# Patient Record
Sex: Male | Born: 1980 | Race: Black or African American | Hispanic: No | Marital: Single | Smoking: Current every day smoker
Health system: Southern US, Community
[De-identification: ages and names within clinical notes are randomized; demographics above are authoritative.]

## PROBLEM LIST (undated history)

## (undated) HISTORY — PX: FOOT SURGERY: SHX648

## (undated) HISTORY — PX: FINGER SURGERY: SHX640

---

## 2009-05-22 ENCOUNTER — Emergency Department (HOSPITAL_COMMUNITY): Admission: EM | Admit: 2009-05-22 | Discharge: 2009-05-22 | Payer: Self-pay | Admitting: Emergency Medicine

## 2009-05-22 ENCOUNTER — Encounter: Payer: Self-pay | Admitting: Orthopedic Surgery

## 2009-05-24 ENCOUNTER — Ambulatory Visit: Payer: Self-pay | Admitting: Orthopedic Surgery

## 2009-05-24 DIAGNOSIS — S62609B Fracture of unspecified phalanx of unspecified finger, initial encounter for open fracture: Secondary | ICD-10-CM | POA: Insufficient documentation

## 2009-05-29 ENCOUNTER — Ambulatory Visit: Payer: Self-pay | Admitting: Orthopedic Surgery

## 2009-05-30 ENCOUNTER — Ambulatory Visit: Payer: Self-pay | Admitting: Orthopedic Surgery

## 2009-05-30 ENCOUNTER — Ambulatory Visit (HOSPITAL_COMMUNITY): Admission: RE | Admit: 2009-05-30 | Discharge: 2009-05-30 | Payer: Self-pay | Admitting: Orthopedic Surgery

## 2009-06-05 ENCOUNTER — Ambulatory Visit: Payer: Self-pay | Admitting: Orthopedic Surgery

## 2009-06-12 ENCOUNTER — Ambulatory Visit: Payer: Self-pay | Admitting: Orthopedic Surgery

## 2009-06-27 ENCOUNTER — Ambulatory Visit: Payer: Self-pay | Admitting: Orthopedic Surgery

## 2009-08-28 ENCOUNTER — Encounter: Payer: Self-pay | Admitting: Orthopedic Surgery

## 2010-11-12 NOTE — Letter (Signed)
Summary: Bradley Martinez discount effec 06/26/09-12/24/09  Bradley Martinez [employer] effec 06/26/09-12/24/09   Imported By: Cammie Sickle 02/12/2010 12:17:22  _____________________________________________________________________  External Attachment:    Type:   Image     Comment:   External Document

## 2011-02-25 NOTE — Op Note (Signed)
NAME:  Bradley Martinez, Bradley Martinez                ACCOUNT NO.:  [id number]   MEDICAL RECORD NO.:  [id number]          PATIENT TYPE:  AMB   LOCATION:  DAY                           FACILITY:  [facility]   PHYSICIAN:  Vickki Hearing, M.D.DATE OF BIRTH:  09-05-1981   DATE OF PROCEDURE:  05/30/2009  DATE OF DISCHARGE:  05/30/2009                               OPERATIVE REPORT   DATE OF INJURY:  May 22, 2009.   MECHANISM:  The patient was lifting weights and a dumbbell fell on his  finger.  He sustained a laceration to the right long finger, fractured  the distal phalanx with angulation displacement, and the laceration was  repaired in the emergency room.  The patient was referred to the office  and scheduled for surgery to repair the nail bed and reduce the  fracture.   PREOPERATIVE DIAGNOSIS:  Open fracture, right long finger.   POSTOPERATIVE DIAGNOSIS:  Open fracture, right long finger.   PROCEDURE:  Removal of nail, repair of nail bed, closed reduction of  distal phalanx, repair of laceration.   SURGEON:  Vickki Hearing, MD   No assistants.   ANESTHETIC:  General.   OPERATIVE FINDINGS:  There was a 1-cm nail bed laceration.  There was a  2-cm skin laceration.  The fracture was apex-volar angulated, was  reduced by repairing the nail bed.   No specimens.  Case was contaminated.  Blood loss minimal.   DETAILS OF PROCEDURE:  Mr. Lafond was identified in the preop area.  His  right long finger was marked as the surgical site.  He was taken to the  operating room, given a gram of Ancef, given general anesthetic.  His  right hand was prepped with Betadine and draped sterilely.  Time-out  procedure was completed.   The limb was exsanguinated with an Esmarch.  The tourniquet was elevated  to 250 mmHg.   We first removed the nail with blunt dissection and came off rather  easily.  Underneath the nail bed, the laceration of the nail bed was  found, this was repaired with 5-0 chromic in an  interrupted fashion.   We next reduced the fracture, took C-arm x-rays with the mini C-arm and  reduced the fracture, and then repaired the laceration of the skin with  3-0 nylon suture.  We did a digital block.  We placed a sterile dressing  with moist saline and a Betadine-soaked gauze and a finger cot dressing.   The tourniquet was released.   The patient will follow up in a couple of days for checkup.  No dressing  change needed at that time.  The patient has hydrocodone for pain 5 mg,  ibuprofen 800 mg.  We will give him 20 Percocet to take for the first  few days.  He has been on Keflex total #28 one b.i.d. 500 mg.      Vickki Hearing, M.D.  Electronically Signed     SEH/MEDQ  D:  05/30/2009  T:  05/31/2009  Job:  [id number]

## 2012-10-03 ENCOUNTER — Emergency Department (INDEPENDENT_AMBULATORY_CARE_PROVIDER_SITE_OTHER)
Admission: EM | Admit: 2012-10-03 | Discharge: 2012-10-03 | Disposition: A | Discharge status: Home / Self Care | Payer: Self-pay | Source: Home / Self Care | Attending: Emergency Medicine | Admitting: Emergency Medicine

## 2012-10-03 ENCOUNTER — Encounter (HOSPITAL_COMMUNITY): Payer: Self-pay | Admitting: *Deleted

## 2012-10-03 DIAGNOSIS — J069 Acute upper respiratory infection, unspecified: Secondary | ICD-10-CM

## 2012-10-03 NOTE — ED Provider Notes (Signed)
Medical screening examination/treatment/procedure(s) were performed by non-physician practitioner and as supervising physician I was immediately available for consultation/collaboration.  Leslee Home, M.D.   Reuben Likes, MD 10/03/12 [phone]

## 2012-10-03 NOTE — ED Notes (Signed)
Patient complains of cough, fever/chills, body aches, head congestion, diarrhea, fatigue, and general malaise x 2 days. Patient states temp at home yesterday was 101.7. Denies nausea and vomiting.

## 2012-10-03 NOTE — ED Provider Notes (Signed)
History     CSN: [id number]  Arrival date & time 10/03/12  1101   First MD Initiated Contact with Patient 10/03/12 1142      Chief Complaint  Patient presents with  . URI    (Consider location/radiation/quality/duration/timing/severity/associated sxs/prior treatment) Patient is a 31 y.o. male presenting with URI. The history is provided by the patient.  URI The primary symptoms include fever, fatigue and cough.  Symptoms associated with the illness include congestion.  Pt reports uri symptoms for two days, states worse was yesterday, symptoms are improved today, expresses concern related.  Fever as high as 101F.  Has been taking Nyquil for symptoms.  History reviewed. No pertinent past medical history.  History reviewed. No pertinent past surgical history.  No family history on file.  History  Substance Use Topics  . Smoking status: Heavy Tobacco Smoker    Types: Cigarettes  . Smokeless tobacco: Not on file  . Alcohol Use: 0.0 oz/week     Comment: occasional      Review of Systems  Constitutional: Positive for fever, appetite change and fatigue.  HENT: Positive for congestion.   Respiratory: Positive for cough.   All other systems reviewed and are negative.    Allergies  Review of patient's allergies indicates no known allergies.  Home Medications  No current outpatient prescriptions on file.  BP 147/92  Pulse 83  Temp 99.6 F (37.6 C) (Oral)  Resp 16  SpO2 96%  Physical Exam  Nursing note and vitals reviewed. Constitutional: He is oriented to person, place, and time. Vital signs are normal. He appears well-developed and well-nourished. He is active and cooperative.  HENT:  Head: Normocephalic.  Right Ear: External ear normal. A middle ear effusion is present.  Left Ear: Tympanic membrane and external ear normal.  Nose: Rhinorrhea present. Right sinus exhibits no maxillary sinus tenderness and no frontal sinus tenderness. Left sinus exhibits no  maxillary sinus tenderness and no frontal sinus tenderness.  Mouth/Throat: Uvula is midline and mucous membranes are normal. Posterior oropharyngeal erythema present. No oropharyngeal exudate or posterior oropharyngeal edema.  Eyes: Conjunctivae normal are normal. Pupils are equal, round, and reactive to light. No scleral icterus.  Neck: Trachea normal and normal range of motion. Neck supple.  Cardiovascular: Normal rate, regular rhythm, S1 normal, normal heart sounds, intact distal pulses and normal pulses.   Pulmonary/Chest: Effort normal and breath sounds normal.  Lymphadenopathy:       Head (right side): No submental, no submandibular, no tonsillar, no preauricular, no posterior auricular and no occipital adenopathy present.       Head (left side): No submental, no submandibular, no tonsillar, no preauricular, no posterior auricular and no occipital adenopathy present.    He has no cervical adenopathy.  Neurological: He is alert and oriented to person, place, and time. No cranial nerve deficit or sensory deficit.  Skin: Skin is warm and dry.  Psychiatric: He has a normal mood and affect. His speech is normal and behavior is normal. Judgment and thought content normal. Cognition and memory are normal.    ED Course  Procedures (including critical care time)  Labs Reviewed - No data to display No results found.   1. URI (upper respiratory infection)       MDM  Increase fluids, rest.  Follow up prn        Johnsie Kindred, NP 10/03/12 1150

## 2012-10-06 ENCOUNTER — Emergency Department (HOSPITAL_COMMUNITY): Payer: No Typology Code available for payment source

## 2012-10-06 ENCOUNTER — Emergency Department (HOSPITAL_COMMUNITY)
Admission: EM | Admit: 2012-10-06 | Discharge: 2012-10-07 | Disposition: A | Discharge status: Home / Self Care | Payer: No Typology Code available for payment source | Attending: Emergency Medicine | Admitting: Emergency Medicine

## 2012-10-06 ENCOUNTER — Encounter (HOSPITAL_COMMUNITY): Payer: Self-pay | Admitting: Emergency Medicine

## 2012-10-06 DIAGNOSIS — Y9389 Activity, other specified: Secondary | ICD-10-CM | POA: Insufficient documentation

## 2012-10-06 DIAGNOSIS — S39012A Strain of muscle, fascia and tendon of lower back, initial encounter: Secondary | ICD-10-CM

## 2012-10-06 DIAGNOSIS — F17200 Nicotine dependence, unspecified, uncomplicated: Secondary | ICD-10-CM | POA: Insufficient documentation

## 2012-10-06 DIAGNOSIS — S335XXA Sprain of ligaments of lumbar spine, initial encounter: Secondary | ICD-10-CM | POA: Insufficient documentation

## 2012-10-06 MED ORDER — IBUPROFEN 800 MG PO TABS
800.0000 mg | ORAL_TABLET | Freq: Once | ORAL | Status: AC
  Administered 2012-10-06: 800 mg via ORAL
  Filled 2012-10-06: qty 1

## 2012-10-06 MED ORDER — CYCLOBENZAPRINE HCL 10 MG PO TABS
10.0000 mg | ORAL_TABLET | Freq: Once | ORAL | Status: AC
  Administered 2012-10-06: 10 mg via ORAL
  Filled 2012-10-06: qty 1
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
## 2012-10-07 MED ORDER — HYDROCODONE-ACETAMINOPHEN 5-325 MG PO TABS: ORAL_TABLET | ORAL | Status: DC

## 2012-10-07 MED ORDER — IBUPROFEN 800 MG PO TABS: 800.0000 mg | ORAL_TABLET | Freq: Three times a day (TID) | ORAL | Status: DC

## 2012-10-07 MED ORDER — CYCLOBENZAPRINE HCL 10 MG PO TABS: 10.0000 mg | ORAL_TABLET | Freq: Three times a day (TID) | ORAL | Status: DC | PRN

## 2012-10-08 NOTE — ED Provider Notes (Signed)
Medical screening examination/treatment/procedure(s) were performed by non-physician practitioner and as supervising physician I was immediately available for consultation/collaboration.  Yutaka Holberg, MD 10/08/12 2353 

## 2018-07-22 ENCOUNTER — Encounter (HOSPITAL_COMMUNITY): Payer: Self-pay

## 2018-07-22 ENCOUNTER — Emergency Department (HOSPITAL_COMMUNITY)
Admission: EM | Admit: 2018-07-22 | Discharge: 2018-07-22 | Disposition: A | Discharge status: Home / Self Care | Payer: BLUE CROSS/BLUE SHIELD | Attending: Emergency Medicine | Admitting: Emergency Medicine

## 2018-07-22 ENCOUNTER — Other Ambulatory Visit: Payer: Self-pay

## 2018-07-22 ENCOUNTER — Emergency Department (HOSPITAL_COMMUNITY): Payer: BLUE CROSS/BLUE SHIELD

## 2018-07-22 DIAGNOSIS — F17210 Nicotine dependence, cigarettes, uncomplicated: Secondary | ICD-10-CM | POA: Diagnosis not present

## 2018-07-22 DIAGNOSIS — M25462 Effusion, left knee: Secondary | ICD-10-CM | POA: Diagnosis present

## 2018-07-22 MED ORDER — IBUPROFEN 800 MG PO TABS
800.0000 mg | ORAL_TABLET | Freq: Once | ORAL | Status: AC
  Administered 2018-07-22: 800 mg via ORAL
  Filled 2018-07-22: qty 1

## 2018-07-22 MED ORDER — IBUPROFEN 600 MG PO TABS: 600.0000 mg | ORAL_TABLET | Freq: Four times a day (QID) | ORAL | 0 refills | Status: DC | PRN
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
## 2018-07-22 NOTE — ED Provider Notes (Signed)
[facility] EMERGENCY DEPARTMENT Provider Note   CSN: [id number] Arrival date & time: 07/22/18  1755     History   Chief Complaint Chief Complaint  Patient presents with  . Knee Pain    HPI Bradley Martinez is a 37 y.o. male with no significant past medical history but has seen Dr Romeo Apple in the past presenting with left knee swelling of his knee cap after a direct blow against a [employer] truck prior to arrival.  He denies pain with movement or walking, but endorses mild "soreness".  He has applied icy hot to the site prior to arrival.  The history is provided by the patient.    History reviewed. No pertinent past medical history.  Patient Active Problem List   Diagnosis Date Noted  . OPEN FRACTURE PHALANX/PHALANGES HAND UNSPECIFIED 05/24/2009    Past Surgical History:  Procedure Laterality Date  . FINGER SURGERY    . FOOT SURGERY          Home Medications    Prior to Admission medications   Medication Sig Start Date End Date Taking? Authorizing Provider  cyclobenzaprine (FLEXERIL) 10 MG tablet Take 1 tablet (10 mg total) by mouth 3 (three) times daily as needed for muscle spasms. 10/07/12   Triplett, Tammy, PA-C  HYDROcodone-acetaminophen (NORCO/VICODIN) 5-325 MG per tablet Take one-two tabs po q 4-6 hrs prn pain 10/07/12   Triplett, Tammy, PA-C  ibuprofen (ADVIL,MOTRIN) 600 MG tablet Take 1 tablet (600 mg total) by mouth every 6 (six) hours as needed. 07/22/18   Burgess Amor, PA-C    Family History No family history on file.  Social History Social History   Tobacco Use  . Smoking status: Heavy Tobacco Smoker    Packs/day: 2.00    Types: Cigars  . Smokeless tobacco: Never Used  Substance Use Topics  . Alcohol use: Yes    Comment: occasional  . Drug use: Yes    Types: Marijuana     Allergies   Patient has no known allergies.   Review of Systems Review of Systems  Constitutional: Negative for fever.  Musculoskeletal: Positive for arthralgias.  Negative for gait problem, joint swelling and myalgias.  Neurological: Negative for weakness and numbness.     Physical Exam Updated Vital Signs BP 113/76 (BP Location: Right Arm)   Pulse 73   Temp 98.3 F (36.8 C) (Oral)   Resp 16   SpO2 96%   Physical Exam  Constitutional: He appears well-developed and well-nourished.  HENT:  Head: Atraumatic.  Neck: Normal range of motion.  Cardiovascular:  Pulses equal bilaterally  Musculoskeletal: He exhibits tenderness.       Left knee: He exhibits swelling. He exhibits no erythema, no LCL laxity, no bony tenderness, normal meniscus and no MCL laxity.  Mild ttp and moderate edema isolated to the left prepatellar bursa.  Soft, ballotable. No erythema.  Neurological: He is alert. He has normal strength. He displays normal reflexes. No sensory deficit.  Skin: Skin is warm and dry.  Psychiatric: He has a normal mood and affect.     ED Treatments / Results  Labs (all labs ordered are listed, but only abnormal results are displayed) Labs Reviewed - No data to display  EKG None  Radiology Dg Knee Complete 4 Views Left  Result Date: 07/22/2018 CLINICAL DATA:  Hit knee on metal bar EXAM: LEFT KNEE - COMPLETE 4+ VIEW COMPARISON:  None. FINDINGS: Anterior soft tissue swelling. Small joint effusion. No acute bony abnormality. Specifically, no fracture,  subluxation, or dislocation. IMPRESSION: Anterior soft tissue swelling with small joint effusion. No bony abnormality. Electronically Signed   By: Charlett Nose M.D.   On: 07/22/2018 19:01    Procedures Procedures (including critical care time)  Medications Ordered in ED Medications  ibuprofen (ADVIL,MOTRIN) tablet 800 mg (800 mg Oral Given 07/22/18 1914)     Initial Impression / Assessment and Plan / ED Course  I have reviewed the triage vital signs and the nursing notes.  Pertinent labs & imaging results that were available during my care of the patient were reviewed by me and  considered in my medical decision making (see chart for details).     Imaging reviewed and discussed with pt.  Ice and heat tx discussed, ibuprofen.  F/u with ortho if sx not improving with tx.   The patient appears reasonably screened and/or stabilized for discharge and I doubt any other medical condition or other [facility] requiring further screening, evaluation, or treatment in the ED at this time prior to discharge.   Final Clinical Impressions(s) / ED Diagnoses   Final diagnoses:  Effusion of left prepatellar bursa    ED Discharge Orders         Ordered    ibuprofen (ADVIL,MOTRIN) 600 MG tablet  Every 6 hours PRN     07/22/18 1910           Burgess Amor, PA-C 07/22/18 1933    Loren Racer, MD 07/28/18 [phone]

## 2018-07-22 NOTE — Discharge Instructions (Signed)
Apply an ice pack to your knee as much as is comfortable for the next 2 days to help minimize any further swelling.  Starting on Sunday,  you may start applying a heating pad for 15 minutes 3 times daily to help encourage the fluid to resolve sooner. However, if you notice the swelling returning, revert to ice only.  Call Dr. Romeo Apple for a recheck if not improving with this treatment. Your xrays are negative today.

## 2018-10-27 ENCOUNTER — Other Ambulatory Visit: Payer: Self-pay

## 2018-10-27 ENCOUNTER — Encounter (HOSPITAL_COMMUNITY): Payer: Self-pay | Admitting: Emergency Medicine

## 2018-10-27 ENCOUNTER — Emergency Department (HOSPITAL_COMMUNITY)
Admission: EM | Admit: 2018-10-27 | Discharge: 2018-10-27 | Disposition: A | Discharge status: Home / Self Care | Payer: BLUE CROSS/BLUE SHIELD | Attending: Emergency Medicine | Admitting: Emergency Medicine

## 2018-10-27 DIAGNOSIS — Y9289 Other specified places as the place of occurrence of the external cause: Secondary | ICD-10-CM | POA: Insufficient documentation

## 2018-10-27 DIAGNOSIS — Y9389 Activity, other specified: Secondary | ICD-10-CM | POA: Insufficient documentation

## 2018-10-27 DIAGNOSIS — M62830 Muscle spasm of back: Secondary | ICD-10-CM

## 2018-10-27 DIAGNOSIS — F17210 Nicotine dependence, cigarettes, uncomplicated: Secondary | ICD-10-CM | POA: Insufficient documentation

## 2018-10-27 DIAGNOSIS — Y999 Unspecified external cause status: Secondary | ICD-10-CM | POA: Diagnosis not present

## 2018-10-27 DIAGNOSIS — S3992XA Unspecified injury of lower back, initial encounter: Secondary | ICD-10-CM | POA: Diagnosis present

## 2018-10-27 MED ORDER — NAPROXEN 500 MG PO TABS: 500.0000 mg | ORAL_TABLET | Freq: Two times a day (BID) | ORAL | 0 refills | Status: DC

## 2018-10-27 MED ORDER — METHOCARBAMOL 500 MG PO TABS: 500.0000 mg | ORAL_TABLET | Freq: Two times a day (BID) | ORAL | 0 refills | Status: DC
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
## 2018-10-27 NOTE — Discharge Instructions (Addendum)
Take the medicine as directed. Do not drive while taking the muscle relaxer as it will make you sleepy. Follow up with your doctor. Return here if symptoms worsen.
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
## 2019-12-24 ENCOUNTER — Ambulatory Visit: Payer: BC Managed Care – PPO | Attending: Internal Medicine

## 2019-12-24 DIAGNOSIS — Z23 Encounter for immunization: Secondary | ICD-10-CM

## 2019-12-24 NOTE — Progress Notes (Signed)
   Covid-19 Vaccination Clinic  Name:  Bradley Martinez    MRN: [id number] DOB: 1981-08-05  12/24/2019  Bradley Martinez was observed post Covid-19 immunization for 15 minutes without incident. He was provided with Vaccine Information Sheet and instruction to access the V-Safe system.   Bradley Martinez was instructed to call 911 with any severe reactions post vaccine: Marland Kitchen Difficulty breathing  . Swelling of face and throat  . A fast heartbeat  . A bad rash all over body  . Dizziness and weakness   Immunizations Administered    Name Date Dose VIS Date Route   Moderna COVID-19 Vaccine 12/24/2019 10:30 AM 0.5 mL 09/13/2019 Intramuscular   Manufacturer: Moderna   Lot: [id number]   NDC: 41287-867-67

## 2020-01-25 ENCOUNTER — Ambulatory Visit: Payer: BC Managed Care – PPO | Attending: Internal Medicine

## 2020-01-25 DIAGNOSIS — Z23 Encounter for immunization: Secondary | ICD-10-CM

## 2020-01-25 NOTE — Progress Notes (Signed)
   Covid-19 Vaccination Clinic  Name:  Bradley Martinez    MRN: [id number] DOB: [birth year]  01/25/2020  Mr. Tallman was observed post Covid-19 immunization for 15 minutes without incident. He was provided with Vaccine Information Sheet and instruction to access the V-Safe system.   Mr. Bathgate was instructed to call 911 with any severe reactions post vaccine: Marland Kitchen Difficulty breathing  . Swelling of face and throat  . A fast heartbeat  . A bad rash all over body  . Dizziness and weakness   Immunizations Administered    Name Date Dose VIS Date Route   Moderna COVID-19 Vaccine 01/25/2020  9:20 AM 0.5 mL 09/13/2019 Intramuscular   Manufacturer: Moderna   Lot: [id number]   NDC: 38685-488-30

## 2023-03-11 ENCOUNTER — Other Ambulatory Visit: Payer: Self-pay

## 2023-03-11 ENCOUNTER — Emergency Department (HOSPITAL_COMMUNITY): Payer: BC Managed Care – PPO

## 2023-03-11 ENCOUNTER — Encounter (HOSPITAL_COMMUNITY): Payer: Self-pay

## 2023-03-11 ENCOUNTER — Emergency Department (HOSPITAL_COMMUNITY)
Admission: EM | Admit: 2023-03-11 | Discharge: 2023-03-11 | Disposition: A | Discharge status: Home / Self Care | Payer: BC Managed Care – PPO | Attending: Emergency Medicine | Admitting: Emergency Medicine

## 2023-03-11 DIAGNOSIS — N200 Calculus of kidney: Secondary | ICD-10-CM

## 2023-03-11 DIAGNOSIS — R109 Unspecified abdominal pain: Secondary | ICD-10-CM | POA: Diagnosis present

## 2023-03-11 DIAGNOSIS — N132 Hydronephrosis with renal and ureteral calculous obstruction: Secondary | ICD-10-CM | POA: Insufficient documentation

## 2023-03-11 LAB — URINALYSIS, ROUTINE W REFLEX MICROSCOPIC
Bacteria, UA: NONE SEEN
Bilirubin Urine: NEGATIVE
Glucose, UA: NEGATIVE mg/dL
Ketones, ur: NEGATIVE mg/dL
Leukocytes,Ua: NEGATIVE
Nitrite: NEGATIVE
Protein, ur: 30 mg/dL — AB
RBC / HPF: >50 RBC/hpf (ref 0–5)
Specific Gravity, Urine: 1.023 (ref 1.005–1.030)
pH: 5 (ref 5.0–8.0)

## 2023-03-11 LAB — COMPREHENSIVE METABOLIC PANEL
ALT: 23 U/L (ref 0–44)
AST: 30 U/L (ref 15–41)
Albumin: 4.4 g/dL (ref 3.5–5.0)
Alkaline Phosphatase: 47 U/L (ref 38–126)
Anion gap: 14 (ref 5–15)
BUN: 16 mg/dL (ref 6–20)
CO2: 20 mmol/L — ABNORMAL LOW (ref 22–32)
Calcium: 8.4 mg/dL — ABNORMAL LOW (ref 8.9–10.3)
Chloride: 104 mmol/L (ref 98–111)
Creatinine, Ser: 1.2 mg/dL (ref 0.61–1.24)
GFR, Estimated: >60 mL/min (ref >60)
Glucose, Bld: 123 mg/dL — ABNORMAL HIGH (ref 70–99)
Potassium: 4.1 mmol/L (ref 3.5–5.1)
Sodium: 138 mmol/L (ref 135–145)
Total Bilirubin: 0.8 mg/dL (ref 0.3–1.2)
Total Protein: 7 g/dL (ref 6.5–8.1)

## 2023-03-11 LAB — CBC WITH DIFFERENTIAL/PLATELET
Abs Immature Granulocytes: 0.03 10*3/uL (ref 0.00–0.07)
Basophils Absolute: 0.1 10*3/uL (ref 0.0–0.1)
Basophils Relative: 1 %
Eosinophils Absolute: 0 10*3/uL (ref 0.0–0.5)
Eosinophils Relative: 0 %
HCT: 41.3 % (ref 39.0–52.0)
Hemoglobin: 14 g/dL (ref 13.0–17.0)
Immature Granulocytes: 0 %
Lymphocytes Relative: 20 %
Lymphs Abs: 1.5 10*3/uL (ref 0.7–4.0)
MCH: 31.7 pg (ref 26.0–34.0)
MCHC: 33.9 g/dL (ref 30.0–36.0)
MCV: 93.4 fL (ref 80.0–100.0)
Monocytes Absolute: 0.6 10*3/uL (ref 0.1–1.0)
Monocytes Relative: 8 %
Neutro Abs: 5.4 10*3/uL (ref 1.7–7.7)
Neutrophils Relative %: 71 %
Platelets: 229 10*3/uL (ref 150–400)
RBC: 4.42 MIL/uL (ref 4.22–5.81)
RDW: 15 % (ref 11.5–15.5)
WBC: 7.7 10*3/uL (ref 4.0–10.5)
nRBC: 0 % (ref 0.0–0.2)

## 2023-03-11 MED ORDER — KETOROLAC TROMETHAMINE 30 MG/ML IJ SOLN
30.0000 mg | Freq: Once | INTRAMUSCULAR | Status: AC
  Administered 2023-03-11: 30 mg via INTRAVENOUS
  Filled 2023-03-11: qty 1

## 2023-03-11 MED ORDER — OXYCODONE-ACETAMINOPHEN 5-325 MG PO TABS: 1.0000 | ORAL_TABLET | Freq: Four times a day (QID) | ORAL | 0 refills | Status: AC | PRN

## 2023-03-11 MED ORDER — SODIUM CHLORIDE 0.9 % IV BOLUS
1000.0000 mL | Freq: Once | INTRAVENOUS | Status: AC
  Administered 2023-03-11: 1000 mL via INTRAVENOUS

## 2023-03-11 MED ORDER — ONDANSETRON HCL 4 MG/2ML IJ SOLN
4.0000 mg | Freq: Once | INTRAMUSCULAR | Status: AC
  Administered 2023-03-11: 4 mg via INTRAVENOUS
  Filled 2023-03-11: qty 2

## 2023-03-11 MED ORDER — ONDANSETRON 4 MG PO TBDP: ORAL_TABLET | ORAL | 0 refills | Status: AC

## 2023-03-11 MED ORDER — TAMSULOSIN HCL 0.4 MG PO CAPS: 0.4000 mg | ORAL_CAPSULE | Freq: Every day | ORAL | 0 refills | Status: AC

## 2023-03-11 MED ORDER — HYDROMORPHONE HCL 1 MG/ML IJ SOLN
0.5000 mg | Freq: Once | INTRAMUSCULAR | Status: AC
  Administered 2023-03-11: 0.5 mg via INTRAVENOUS
  Filled 2023-03-11: qty 0.5

## 2023-03-11 MED ORDER — OXYCODONE-ACETAMINOPHEN 5-325 MG PO TABS: 1.0000 | ORAL_TABLET | Freq: Four times a day (QID) | ORAL | 0 refills | Status: DC | PRN

## 2023-03-11 NOTE — ED Provider Notes (Signed)
[place] EMERGENCY DEPARTMENT AT [facility] Provider Note   CSN: [id number] Arrival date & time: 03/11/23  4098     History  Chief Complaint  Patient presents with   Flank Pain    Bradley Martinez is a 42 y.o. male.  Patient complains of pain in his left flank that woke him up today.  Patient has past medical history of obesity  The history is provided by the patient and medical records. No language interpreter was used.  Flank Pain This is a new problem. The current episode started 3 to 5 hours ago. The problem occurs constantly. The problem has not changed since onset.Pertinent negatives include no chest pain, no abdominal pain and no headaches. Nothing aggravates the symptoms. Nothing relieves the symptoms. He has tried nothing for the symptoms. The treatment provided no relief.       Home Medications Prior to Admission medications   Medication Sig Start Date End Date Taking? Authorizing Provider  ondansetron (ZOFRAN-ODT) 4 MG disintegrating tablet 4mg  ODT q4 hours prn nausea/vomit 03/11/23  Yes Bethann Berkshire, MD  tamsulosin (FLOMAX) 0.4 MG CAPS capsule Take 1 capsule (0.4 mg total) by mouth daily. 03/11/23  Yes Bethann Berkshire, MD  oxyCODONE-acetaminophen (PERCOCET/ROXICET) 5-325 MG tablet Take 1 tablet by mouth every 6 (six) hours as needed for severe pain. 03/11/23   Bethann Berkshire, MD      Allergies    Patient has no known allergies.    Review of Systems   Review of Systems  Constitutional:  Negative for appetite change and fatigue.  HENT:  Negative for congestion, ear discharge and sinus pressure.   Eyes:  Negative for discharge.  Respiratory:  Negative for cough.   Cardiovascular:  Negative for chest pain.  Gastrointestinal:  Negative for abdominal pain and diarrhea.  Genitourinary:  Positive for flank pain. Negative for frequency and hematuria.  Musculoskeletal:  Negative for back pain.  Skin:  Negative for rash.  Neurological:  Negative for seizures  and headaches.  Psychiatric/Behavioral:  Negative for hallucinations.     Physical Exam Updated Vital Signs BP 133/86   Pulse 62   Temp 97.9 F (36.6 C)   Resp 16   Ht 6\' 4"  (1.93 m)   Wt 108.9 kg   SpO2 100%   BMI 29.21 kg/m  Physical Exam Vitals and nursing note reviewed.  Constitutional:      Appearance: He is well-developed.  HENT:     Head: Normocephalic.     Nose: Nose normal.  Eyes:     General: No scleral icterus.    Conjunctiva/sclera: Conjunctivae normal.  Neck:     Thyroid: No thyromegaly.  Cardiovascular:     Rate and Rhythm: Normal rate and regular rhythm.     Heart sounds: No murmur heard.    No friction rub. No gallop.  Pulmonary:     Breath sounds: No stridor. No wheezing or rales.  Chest:     Chest wall: No tenderness.  Abdominal:     General: There is no distension.     Tenderness: There is no abdominal tenderness. There is no rebound.  Genitourinary:    Comments: Tender left flank Musculoskeletal:        General: Normal range of motion.     Cervical back: Neck supple.  Lymphadenopathy:     Cervical: No cervical adenopathy.  Skin:    Findings: No erythema or rash.  Neurological:     Mental Status: He is alert and oriented to  person, place, and time.     Motor: No abnormal muscle tone.     Coordination: Coordination normal.  Psychiatric:        Behavior: Behavior normal.     ED Results / Procedures / Treatments   Labs (all labs ordered are listed, but only abnormal results are displayed) Labs Reviewed  COMPREHENSIVE METABOLIC PANEL - Abnormal; Notable for the following components:      Result Value   CO2 20 (*)    Glucose, Bld 123 (*)    Calcium 8.4 (*)    All other components within normal limits  URINALYSIS, ROUTINE W REFLEX MICROSCOPIC - Abnormal; Notable for the following components:   Hgb urine dipstick MODERATE (*)    Protein, ur 30 (*)    All other components within normal limits  CBC WITH DIFFERENTIAL/PLATELET  CBC WITH  DIFFERENTIAL/PLATELET    EKG None  Radiology CT Renal Stone Study  Result Date: 03/11/2023 CLINICAL DATA:  Left flank pain since 3:30 a.m. EXAM: CT ABDOMEN AND PELVIS WITHOUT CONTRAST TECHNIQUE: Multidetector CT imaging of the abdomen and pelvis was performed following the standard protocol without IV contrast. RADIATION DOSE REDUCTION: This exam was performed according to the departmental dose-optimization program which includes automated exposure control, adjustment of the mA and/or kV according to patient size and/or use of iterative reconstruction technique. COMPARISON:  None Available. FINDINGS: Lower chest: The lung bases are clear. The imaged heart is unremarkable. Hepatobiliary: The liver and gallbladder are unremarkable. There is no biliary ductal dilatation. Pancreas: Unremarkable. Spleen: Unremarkable. Adrenals/Urinary Tract: The adrenals are unremarkable. There is a 1.8 cm simple cysts in the left kidney requiring no specific imaging follow-up. There are no suspicious parenchymal lesions, within the confines of noncontrast technique. There is a 3 mm stone in the distal left ureter just proximal to the UVJ with mild upstream hydroureteronephrosis. There is no stone in the right kidney or along the course of the right ureter. There is no right hydronephrosis. The bladder is decompressed but grossly unremarkable. Stomach/Bowel: The stomach is unremarkable. There is no evidence of bowel obstruction. There is no abnormal bowel wall thickening or inflammatory change. The appendix is normal. Vascular/Lymphatic: The abdominal aorta is normal in course and caliber. There is no abdominopelvic lymphadenopathy. Reproductive: The prostate and seminal vesicles are unremarkable. Other: There is no ascites or free air. Musculoskeletal: There is no acute osseous abnormality or suspicious osseous lesion. There is disc space narrowing with vacuum disc phenomenon and degenerative endplate change at L5-S1.  IMPRESSION: 3 mm stone in the distal left ureter just proximal to the UVJ with mild upstream hydroureteronephrosis. Electronically Signed   By: Lesia Hausen M.D.   On: 03/11/2023 08:12    Procedures Procedures    Medications Ordered in ED Medications  ketorolac (TORADOL) 30 MG/ML injection 30 mg (30 mg Intravenous Given 03/11/23 0741)  ondansetron (ZOFRAN) injection 4 mg (4 mg Intravenous Given 03/11/23 0741)  HYDROmorphone (DILAUDID) injection 0.5 mg (0.5 mg Intravenous Given 03/11/23 1046)  ondansetron (ZOFRAN) injection 4 mg (4 mg Intravenous Given 03/11/23 1046)  sodium chloride 0.9 % bolus 1,000 mL (0 mLs Intravenous Stopped 03/11/23 1319)    ED Course/ Medical Decision Making/ A&P                             Medical Decision Making Amount and/or Complexity of Data Reviewed Labs: ordered. Radiology: ordered.  Risk Prescription drug management.  This patient presents to  the ED for concern of flank pain, this involves an extensive number of treatment options, and is a complaint that carries with it a high risk of complications and morbidity.  The differential diagnosis includes UTI, kidney stone   Co morbidities that complicate the patient evaluation  Obesity   Additional history obtained:  Additional history obtained from patient External records from outside source obtained and reviewed including hospital records   Lab Tests:  I Ordered, and personally interpreted labs.  The pertinent results include: Urine positive for blood   Imaging Studies ordered:  I ordered imaging studies including CT abdomen I independently visualized and interpreted imaging which showed 3 mm left distal ureteral stone I agree with the radiologist interpretation   Cardiac Monitoring: / EKG:  The patient was maintained on a cardiac monitor.  I personally viewed and interpreted the cardiac monitored which showed an underlying rhythm of: Normal sinus   Consultations Obtained:  No  consultant  Problem List / ED Course / Critical interventions / Medication management  Kidney stone I ordered medication including Toradol and Dilaudid Reevaluation of the patient after these medicines showed that the patient improved I have reviewed the patients home medicines and have made adjustments as needed   Social Determinants of Health:  None   Test / Admission - Considered:  None  Patient with a kidney stone.  His symptoms are relieved with pain medicine and nausea medicine.  He will be discharged home to follow-up with urology and is put on Flomax and Zofran and Percocet       Final Clinical Impression(s) / ED Diagnoses Final diagnoses:  Kidney stone    Rx / DC Orders ED Discharge Orders          Ordered    ondansetron (ZOFRAN-ODT) 4 MG disintegrating tablet        03/11/23 1401    tamsulosin (FLOMAX) 0.4 MG CAPS capsule  Daily        03/11/23 1401    oxyCODONE-acetaminophen (PERCOCET/ROXICET) 5-325 MG tablet  Every 6 hours PRN,   Status:  Discontinued        03/11/23 1401    oxyCODONE-acetaminophen (PERCOCET/ROXICET) 5-325 MG tablet  Every 6 hours PRN        03/11/23 1403              Bethann Berkshire, MD 03/15/23 1317

## 2023-03-11 NOTE — ED Triage Notes (Signed)
Patient says left sided intermittent sharp flank pain woke him up this morning. He had 3 episodes of clear vomit and is having chills. Nausea has subsided

## 2023-03-11 NOTE — Discharge Instructions (Addendum)
To follow-up with [facility] next week.  You can take Motrin 800 mg 3 times a day as needed for pain.  If that does not help then you can take the Percocet prescription for pain.
